# Patient Record
Sex: Female | Born: 1960 | Race: White | Hispanic: No | Marital: Married | Smoking: Never smoker
Health system: Southern US, Community
[De-identification: ages and names within clinical notes are randomized; demographics above are authoritative.]

## PROBLEM LIST (undated history)

## (undated) DIAGNOSIS — K219 Gastro-esophageal reflux disease without esophagitis: Secondary | ICD-10-CM

## (undated) DIAGNOSIS — M629 Disorder of muscle, unspecified: Secondary | ICD-10-CM

## (undated) DIAGNOSIS — E119 Type 2 diabetes mellitus without complications: Secondary | ICD-10-CM

## (undated) HISTORY — PX: TONSILLECTOMY: SUR1361

---

## 2006-06-03 ENCOUNTER — Emergency Department (HOSPITAL_COMMUNITY): Admission: EM | Admit: 2006-06-03 | Discharge: 2006-06-03 | Payer: Self-pay | Admitting: Emergency Medicine

## 2007-12-02 IMAGING — CR DG CHEST 2V
2 series · 2 of 2 positions shown · non-contrast
Comparison: none

CLINICAL DATA: 45-year-old female.  Weakness, chest fluttering, and shortness of breath. 
CHEST - 2 VIEW:

[w chest pa]
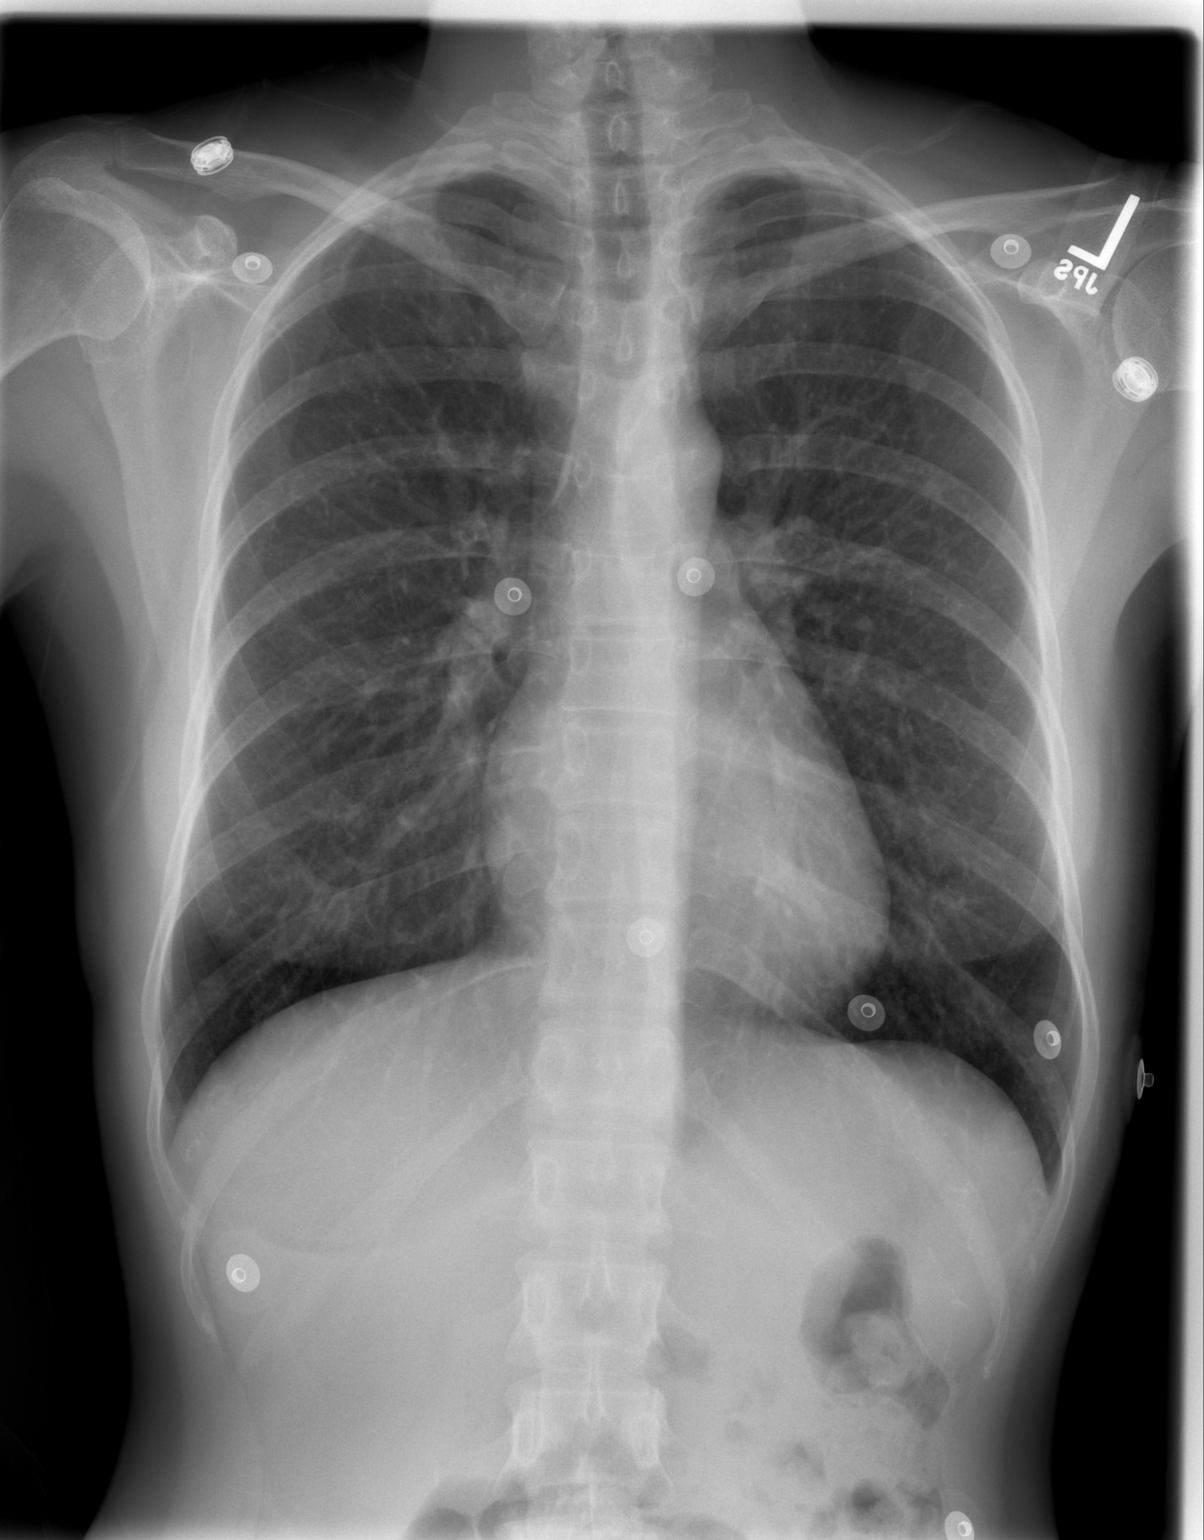

[w chest lat]
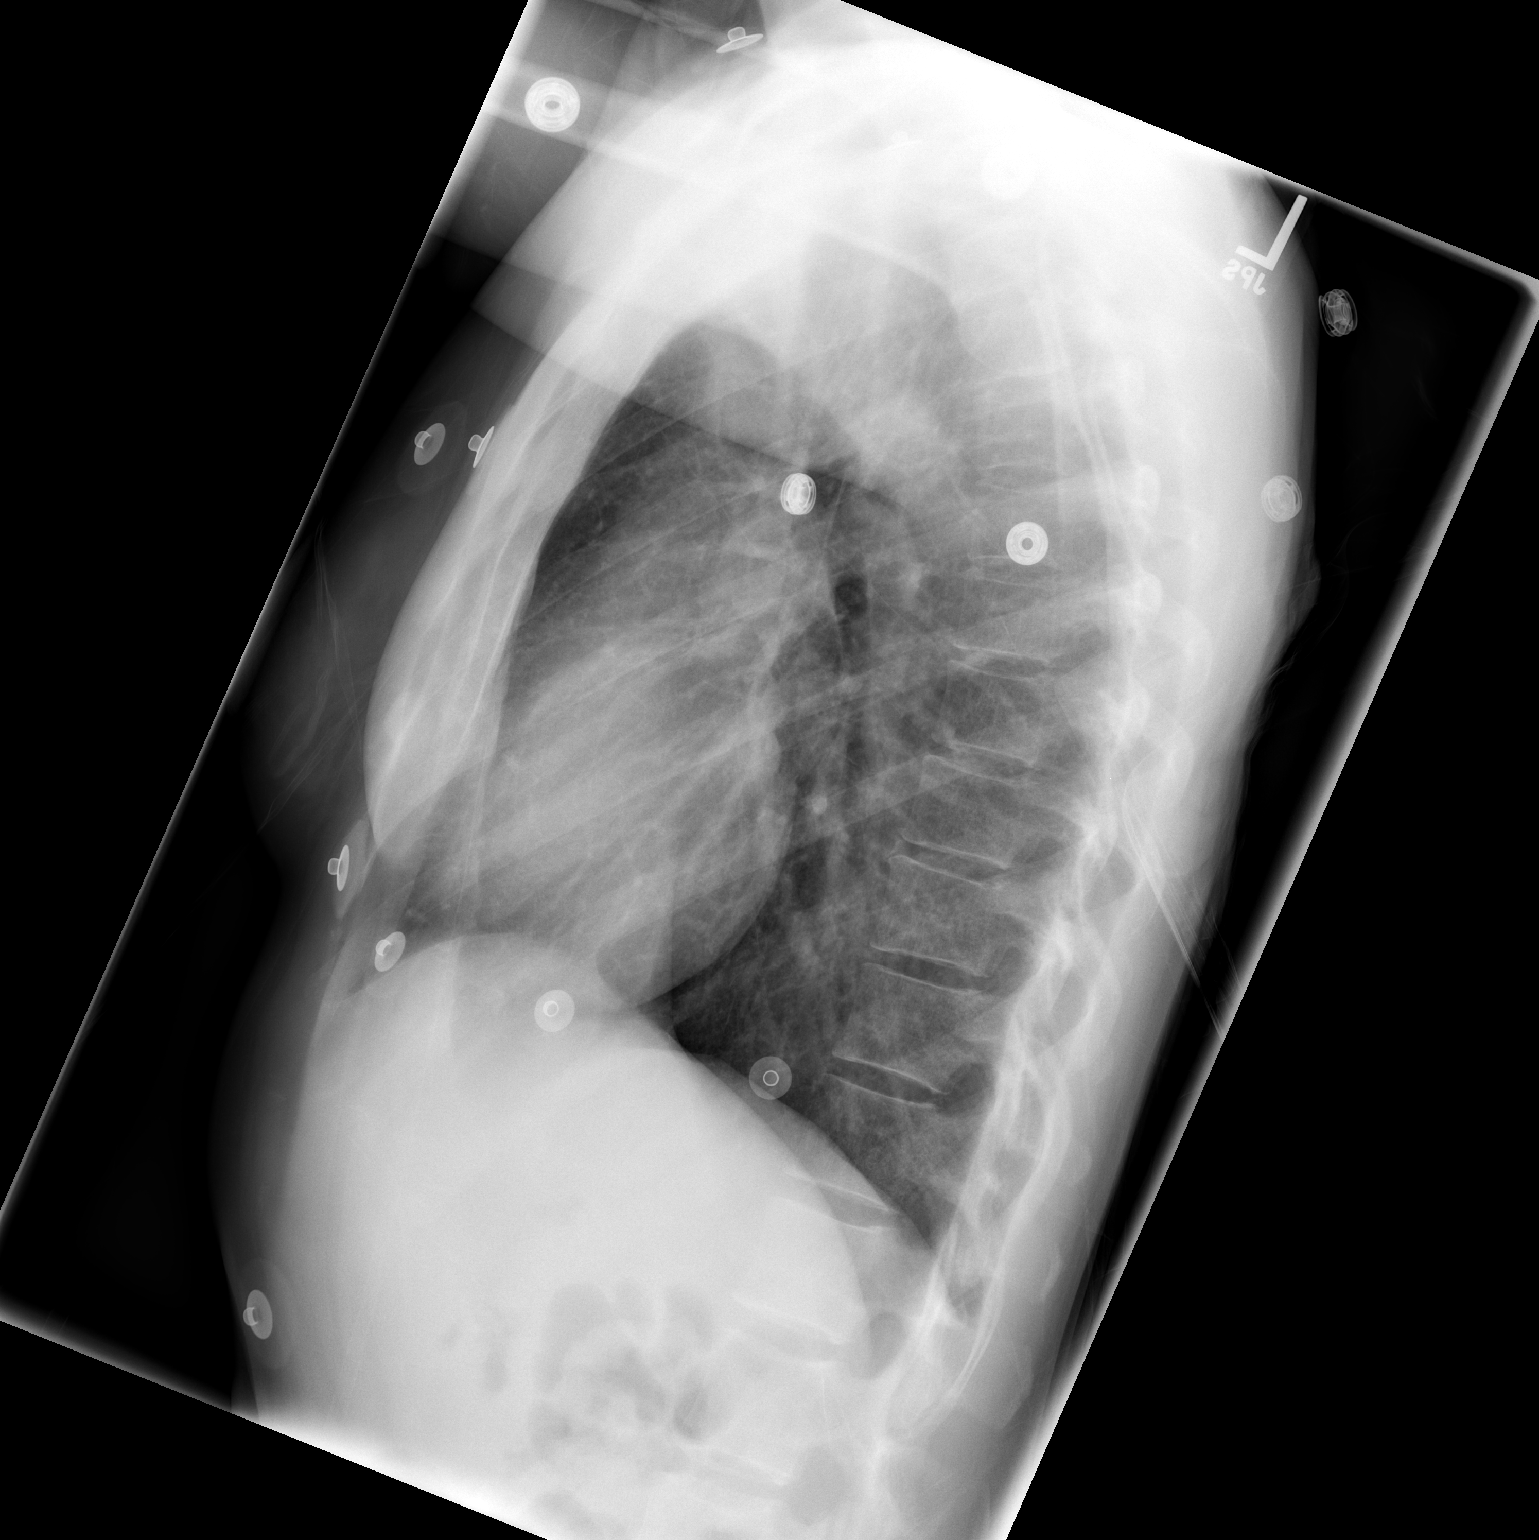

[2 of 2 positions shown; findings below may reference images not displayed]

FINDINGS: Normal heart size.  Mild hyperinflation and interstitial prominence.  No acute consolidation, definite pneumonia, edema, effusion, or pneumothorax.
IMPRESSION: 1.  Mild hyperinflation and bronchial thickening.  
2.  No acute pneumonia.

## 2007-12-02 IMAGING — CT CT HEAD W/O CM
1 series · 16 of 28 positions shown, 20 images · IV contrast (agent unspecified)
Comparison: None.

CLINICAL DATA: Weakness and slurred speech.
 HEAD CT WITHOUT CONTRAST:
TECHNIQUE: Contiguous axial images were obtained from the base of the skull through the vertex according to standard protocol without contrast.

[Series 2: brain · axial · 0.47mm/px · z∈[+141,+274]mm · 16 of 28 slices shown, 20 images]
[im 2/28  brain]
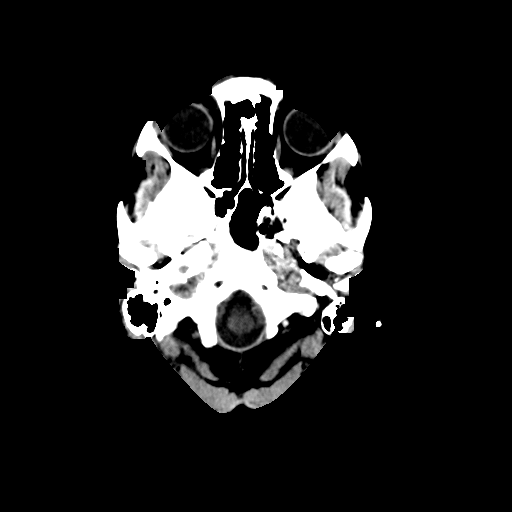
[im 2/28  bone]
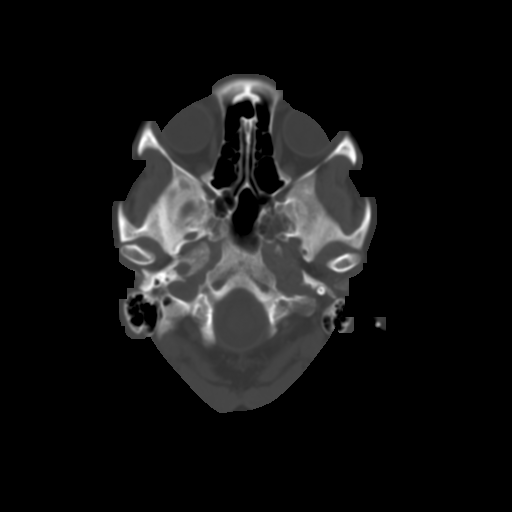
[im 4/28  brain]
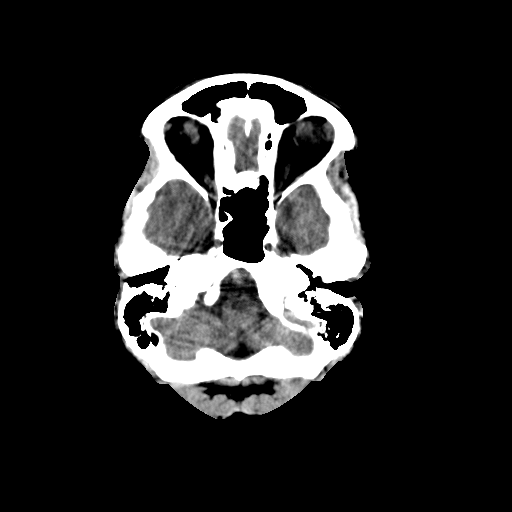
[im 6/28  brain]
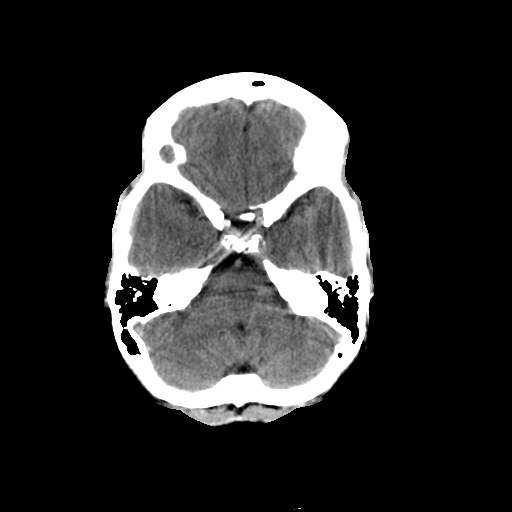
[im 7/28  brain]
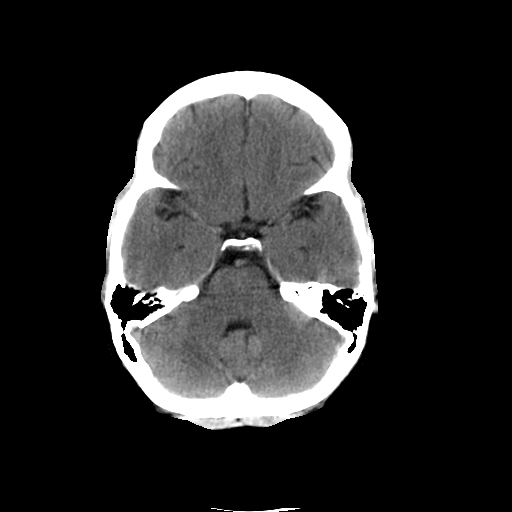
[im 9/28  brain]
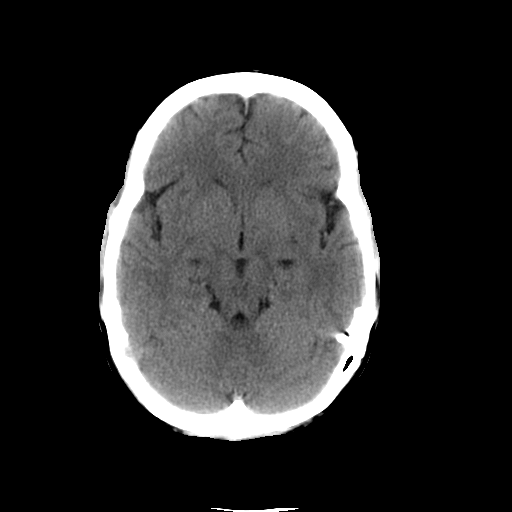
[im 9/28  bone]
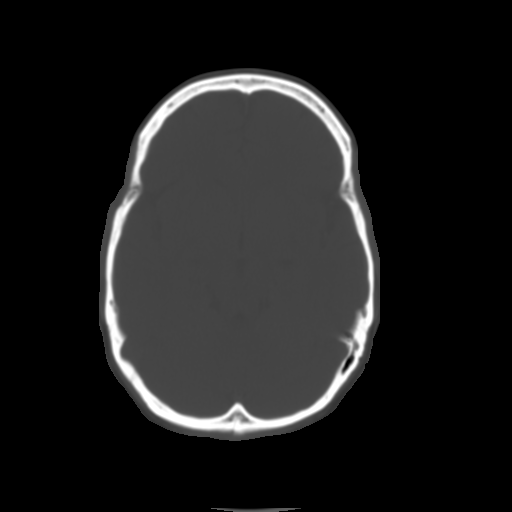
[im 10/28  brain]
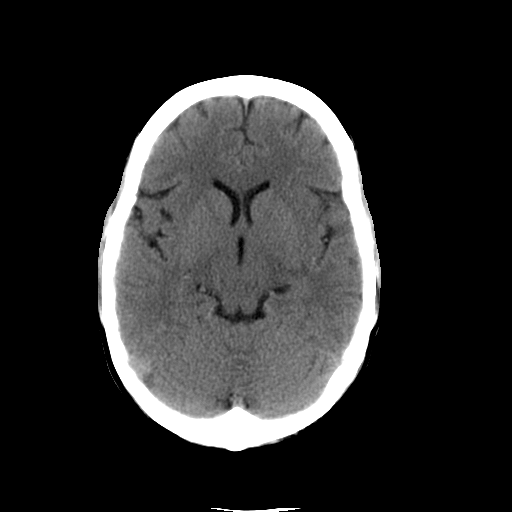
[im 12/28  brain]
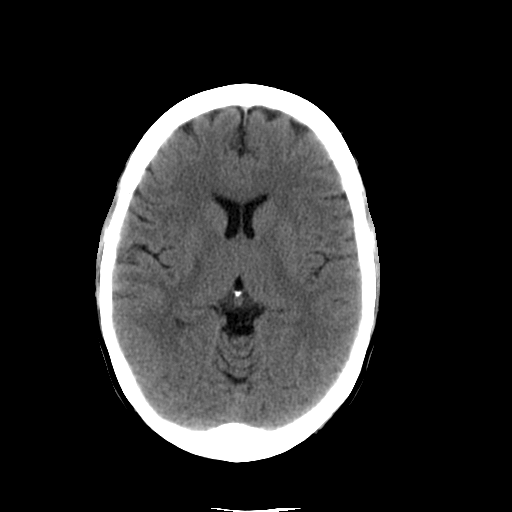
[im 14/28  brain]
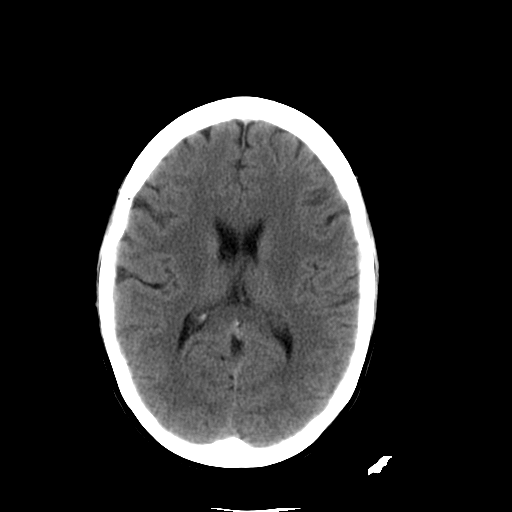
[im 15/28  brain]
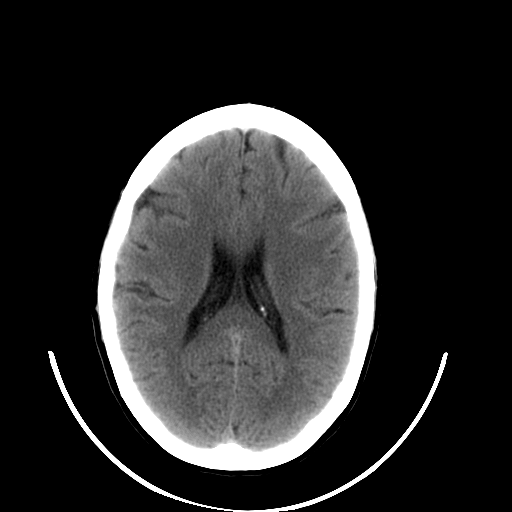
[im 15/28  bone]
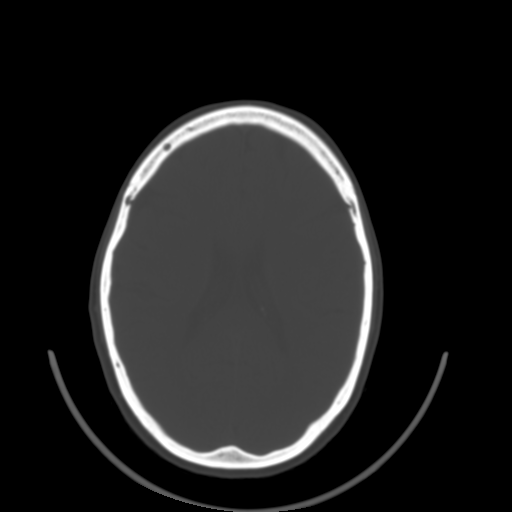
[im 17/28  brain]
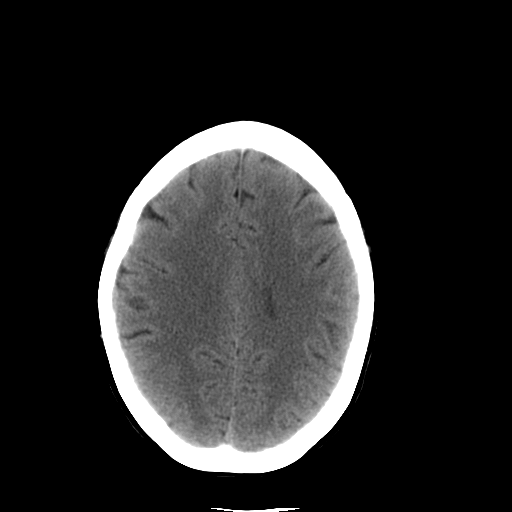
[im 19/28  brain]
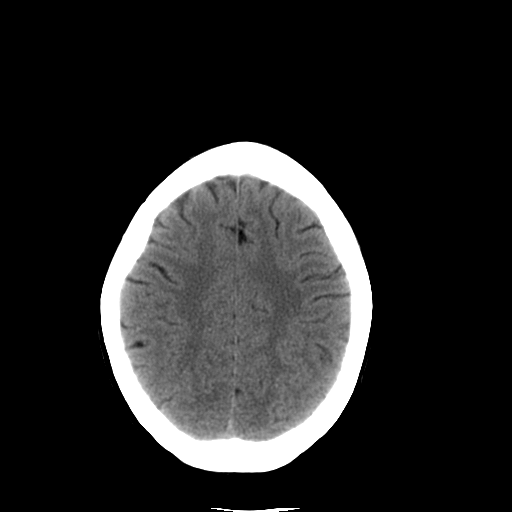
[im 20/28  brain]
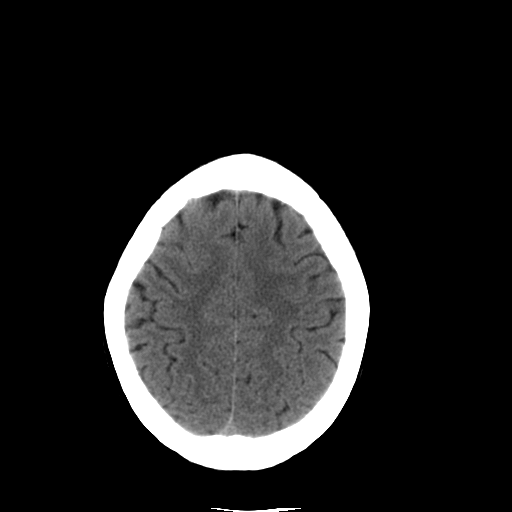
[im 22/28  brain]
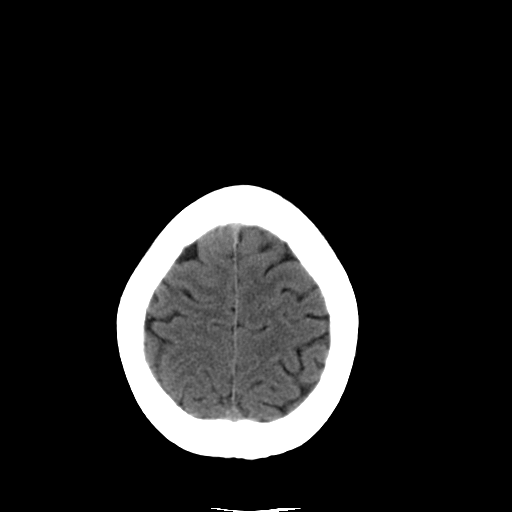
[im 22/28  bone]
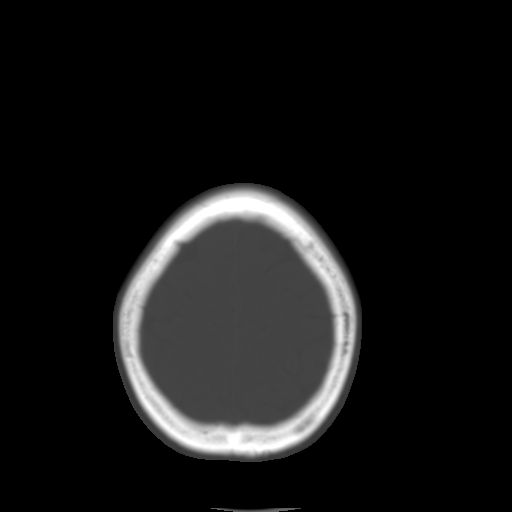
[im 23/28  brain]
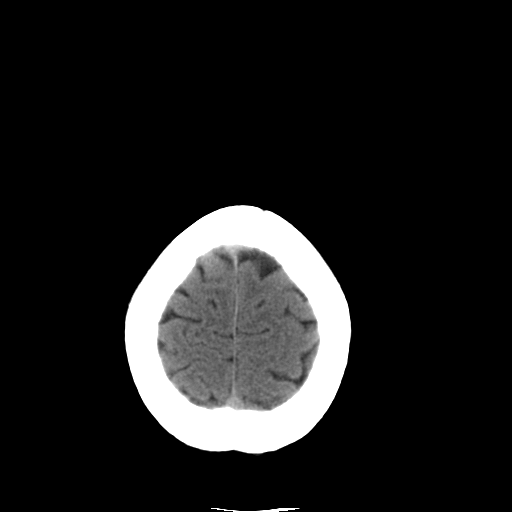
[im 25/28  brain]
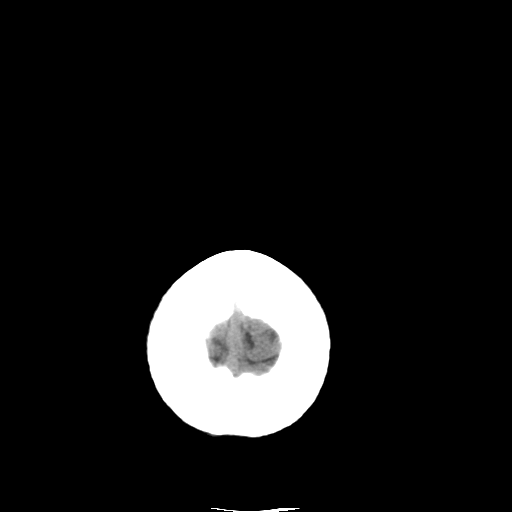
[im 27/28  brain]
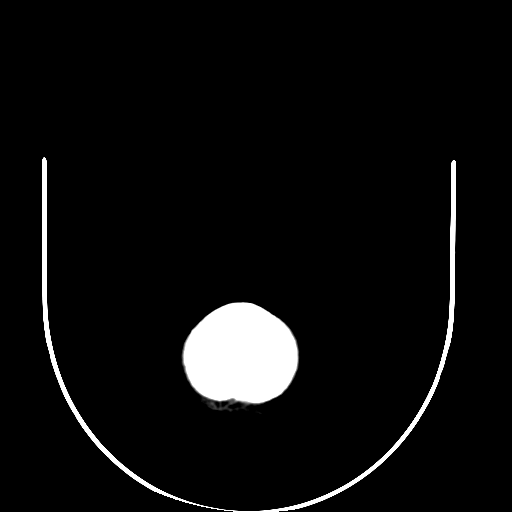

[16 of 28 positions shown; findings below may reference images not displayed]

FINDINGS: Ventricular size and CSF spaces normal. No acute or focal abnormality.  Specifically, no obvious acute infarct or bleed. No that acute ischemic events may be occult on CT in the [DATE] hours.
 Calvarium intact. No fluid in the sinus is visualized.
IMPRESSION: Normal exam ? see note above regarding insensitivity of CT in the early imaging of acute ischemic events.

## 2011-09-03 ENCOUNTER — Encounter: Payer: Self-pay | Admitting: Emergency Medicine

## 2011-09-03 ENCOUNTER — Emergency Department
Admission: EM | Admit: 2011-09-03 | Discharge: 2011-09-03 | Disposition: A | Discharge status: Home / Self Care | Payer: BC Managed Care – PPO | Source: Home / Self Care | Attending: Emergency Medicine | Admitting: Emergency Medicine

## 2011-09-03 DIAGNOSIS — R51 Headache: Secondary | ICD-10-CM

## 2011-09-03 DIAGNOSIS — H5710 Ocular pain, unspecified eye: Secondary | ICD-10-CM

## 2011-09-03 HISTORY — DX: Disorder of muscle, unspecified: M62.9

## 2011-09-03 HISTORY — DX: Gastro-esophageal reflux disease without esophagitis: K21.9

## 2011-09-03 HISTORY — DX: Type 2 diabetes mellitus without complications: E11.9

## 2011-09-03 MED ORDER — AZITHROMYCIN 250 MG PO TABS: ORAL_TABLET | ORAL | Status: AC

## 2011-09-03 NOTE — ED Provider Notes (Signed)
History     CSN: [id number]  Arrival date & time 09/03/11  1620   First MD Initiated Contact with Patient 09/03/11 1657      Chief Complaint  Patient presents with  . Eye Pain    (Consider location/radiation/quality/duration/timing/severity/associated sxs/prior treatment) HPI This is a 51 year old white female who presents today with retro-ocular pain for one week.  No injury no history of migraines.  Her last period was about a year ago with menopause.  She describes it as a shooting pain that feels like it's behind her eyes which only last for 5-30 seconds.  The other time she has a mild dull ache.  No recent stress.  No visual disturbances or blurred vision or dizziness, or other headache, nausea or vomiting.  She has no other upper respiratory symptoms or congestion.  She has not had pain like this in the past.  She does wear glasses and most recently went to her eye doctor a few months ago.  She does not wear contacts.  No trauma or exposure to flying debris.  Past Medical History  Diagnosis Date  . Type 2 diabetes mellitus   . Acid reflux   . Muscle disorder     Past Surgical History  Procedure Date  . Tonsillectomy     Family History  Problem Relation Age of Onset  . Cancer Mother     History  Substance Use Topics  . Smoking status: Never Smoker   . Smokeless tobacco: Not on file  . Alcohol Use: Yes    OB History    Grav Para Term Preterm Abortions TAB SAB Ect Mult Living                  Review of Systems  All other systems reviewed and are negative.    Allergies  Penicillins and Sulfa antibiotics  Home Medications   Current Outpatient Rx  Name Route Sig Dispense Refill  . LANSOPRAZOLE 15 MG PO CPDR Oral Take 15 mg by mouth daily.    Marland Kitchen LEVETIRACETAM 500 MG PO TABS Oral Take 500 mg by mouth every 12 (twelve) hours.    Marland Kitchen SIMVASTATIN 40 MG PO TABS Oral Take 40 mg by mouth every evening.    . AZITHROMYCIN 250 MG PO TABS  Use as directed 1 each 0     BP 108/72  Pulse 69  Temp(Src) 98.6 F (37 C) (Oral)  Resp 16  Ht 5' (1.524 m)  Wt 103 lb (46.72 kg)  BMI 20.12 kg/m2  SpO2 100%  Physical Exam  Nursing note and vitals reviewed. Constitutional: She is oriented to person, place, and time. She appears well-developed and well-nourished.  HENT:  Head: Normocephalic and atraumatic.  Right Ear: Tympanic membrane, external ear and ear canal normal.  Left Ear: Tympanic membrane and external ear normal.  Nose: Nose normal. Right sinus exhibits no maxillary sinus tenderness. Left sinus exhibits no maxillary sinus tenderness.  Mouth/Throat: Uvula is midline, oropharynx is clear and moist and mucous membranes are normal.  Eyes: EOM are normal. Pupils are equal, round, and reactive to light. Right eye exhibits no discharge and no exudate. Left eye exhibits no discharge and no exudate. Right conjunctiva is not injected. Right conjunctiva has no hemorrhage. Left conjunctiva is not injected. Left conjunctiva has no hemorrhage. No scleral icterus. Right eye exhibits normal extraocular motion. Left eye exhibits normal extraocular motion. Pupils are equal.  Fundoscopic exam:      The right eye shows no arteriolar narrowing,  no exudate, no hemorrhage and no papilledema.       The left eye shows no arteriolar narrowing, no exudate, no hemorrhage and no papilledema.  Neck: Neck supple.  Cardiovascular: Regular rhythm and normal heart sounds.   Pulmonary/Chest: Effort normal and breath sounds normal. No respiratory distress.  Neurological: She is alert and oriented to person, place, and time.  Skin: Skin is warm and dry.  Psychiatric: She has a normal mood and affect. Her speech is normal.    ED Course  Procedures (including critical care time)  Labs Reviewed - No data to display No results found.   1. Eye pain   2. Headache       MDM   The differential diagnosis for eye pain includes sinusitis, migraine, I pathology, intracranial, or  headache.  We are going to give her a prescription for a Z-Pak to try since this could be ethmoid sinusitis.  I also gave her a pill of Imitrex to try if the antibiotics are not working.  I told her if the pain gets worse or she is developing new symptoms over the weekend that she will need to come back here.  I do not believe this is an intracranial process.  I also do not believe this is eye-related.  However she is not improving, she will need to followup with her PCP after the weekend versus her ophthalmologist.  Marlaine Hind, MD 09/03/11 [phone]

## 2011-09-03 NOTE — ED Notes (Signed)
Has experienced pain behind left eye approximately one week. No known injury; no hx migraines.

## 2020-03-13 ENCOUNTER — Other Ambulatory Visit: Payer: Self-pay | Admitting: Nurse Practitioner

## 2020-03-13 ENCOUNTER — Telehealth (HOSPITAL_COMMUNITY): Payer: Self-pay | Admitting: Nurse Practitioner

## 2020-03-13 DIAGNOSIS — U071 COVID-19: Secondary | ICD-10-CM

## 2020-03-13 NOTE — Progress Notes (Signed)
I connected by phone with Jacqueline Villa on 03/13/2020 at 4:53 PM to discuss the potential use of a new treatment for mild to moderate COVID-19 viral infection in non-hospitalized patients.  This patient is a 59 y.o. female that meets the FDA criteria for Emergency Use Authorization of COVID monoclonal antibody casirivimab/imdevimab, bamlanivimab/eteseviamb, or sotrovimab.  Has a (+) direct SARS-CoV-2 viral test result  Has mild or moderate COVID-19   Is NOT hospitalized due to COVID-19  Is within 10 days of symptom onset  Has at least one of the high risk factor(s) for progression to severe COVID-19 and/or hospitalization as defined in EUA.  Specific high risk criteria : BMI > 25 and Immunosuppressive Disease or Treatment   I have spoken and communicated the following to the patient or parent/caregiver regarding COVID monoclonal antibody treatment:  1. FDA has authorized the emergency use for the treatment of mild to moderate COVID-19 in adults and pediatric patients with positive results of direct SARS-CoV-2 viral testing who are 43 years of age and older weighing at least 40 kg, and who are at high risk for progressing to severe COVID-19 and/or hospitalization.  2. The significant known and potential risks and benefits of COVID monoclonal antibody, and the extent to which such potential risks and benefits are unknown.  3. Information on available alternative treatments and the risks and benefits of those alternatives, including clinical trials.  4. Patients treated with COVID monoclonal antibody should continue to self-isolate and use infection control measures (e.g., wear mask, isolate, social distance, avoid sharing personal items, clean and disinfect "high touch" surfaces, and frequent handwashing) according to CDC guidelines.   5. The patient or parent/caregiver has the option to accept or refuse COVID monoclonal antibody treatment.  After reviewing this information with the  patient, the patient has agreed to receive one of the available covid 19 monoclonal antibodies and will be provided an appropriate fact sheet prior to infusion. [name], NP 03/13/2020 4:53 PM

## 2020-03-13 NOTE — Telephone Encounter (Signed)
Called to discuss with patient about Covid symptoms and the use of the monoclonal antibody infusion for those with mild to moderate Covid symptoms and at a high risk of hospitalization.     Pt appears to qualify for this infusion due to co-morbid conditions and/or a member of an at-risk group in accordance with the FDA Emergency Use Authorization.    Risk factors include: Sjogren's Syndrome   Symptom onset: 03/08/20 with cough, congestion, HA, fever, and myalgia.   Tested positive for COVID 19: 03/10/20 at [facility] in [place]. on [address]; she will bring positive results with her to the clinic.   Discussed information regarding costs of monoclonal antibody treatment, given both CPT & REV codes, and encouraged patient to call their health insurance company to verify cost of treatment that pt will be financially responsible for.  Patient aware they will receive a call from APP for further information and to schedule appointment. All questions answered.

## 2020-03-13 NOTE — Telephone Encounter (Signed)
Left message for pt to call hotline.

## 2020-03-13 NOTE — Telephone Encounter (Signed)
Left message for pt to call hotline.  History reflects Sjogren's Syndrome.

## 2020-03-14 ENCOUNTER — Ambulatory Visit (HOSPITAL_COMMUNITY)
Admission: RE | Admit: 2020-03-14 | Discharge: 2020-03-14 | Disposition: A | Discharge status: Home / Self Care | Payer: BC Managed Care – PPO | Source: Ambulatory Visit | Attending: Pulmonary Disease | Admitting: Pulmonary Disease

## 2020-03-14 ENCOUNTER — Other Ambulatory Visit (HOSPITAL_COMMUNITY): Payer: Self-pay

## 2020-03-14 DIAGNOSIS — U071 COVID-19: Secondary | ICD-10-CM | POA: Diagnosis not present

## 2020-03-14 MED ORDER — SOTROVIMAB 500 MG/8ML IV SOLN
500.0000 mg | Freq: Once | INTRAVENOUS | Status: AC
  Administered 2020-03-14: 500 mg via INTRAVENOUS
  Filled 2020-03-14: qty 8

## 2020-03-14 MED ORDER — METHYLPREDNISOLONE SODIUM SUCC 125 MG IJ SOLR: 125.0000 mg | Freq: Once | INTRAMUSCULAR | Status: DC | PRN

## 2020-03-14 MED ORDER — EPINEPHRINE 0.3 MG/0.3ML IJ SOAJ: 0.3000 mg | Freq: Once | INTRAMUSCULAR | Status: DC | PRN

## 2020-03-14 MED ORDER — FAMOTIDINE IN NACL 20-0.9 MG/50ML-% IV SOLN: 20.0000 mg | Freq: Once | INTRAVENOUS | Status: DC | PRN

## 2020-03-14 MED ORDER — DIPHENHYDRAMINE HCL 50 MG/ML IJ SOLN: 50.0000 mg | Freq: Once | INTRAMUSCULAR | Status: DC | PRN

## 2020-03-14 MED ORDER — SODIUM CHLORIDE 0.9 % IV SOLN: INTRAVENOUS | Status: DC | PRN

## 2020-03-14 MED ORDER — ALBUTEROL SULFATE HFA 108 (90 BASE) MCG/ACT IN AERS: 2.0000 | INHALATION_SPRAY | Freq: Once | RESPIRATORY_TRACT | Status: DC | PRN

## 2020-03-14 MED ORDER — SODIUM CHLORIDE 0.9 % IV SOLN: 1200.0000 mg | Freq: Once | INTRAVENOUS | Status: DC

## 2020-03-14 NOTE — Progress Notes (Signed)
Patient reviewed Fact Sheet for Patients, Parents, and Caregivers for Emergency Use Authorization (EUA) of Sotrovimab for the Treatment of Coronavirus. Patient also reviewed and is agreeable to the estimated cost of treatment. Patient is agreeable to proceed.   

## 2020-03-14 NOTE — Discharge Instructions (Signed)
10 Things You Can Do to Manage Your COVID-19 Symptoms at Home If you have possible or confirmed COVID-19: 1. Stay home from work and school. And stay away from other public places. If you must go out, avoid using any kind of public transportation, ridesharing, or taxis. 2. Monitor your symptoms carefully. If your symptoms get worse, call your healthcare provider immediately. 3. Get rest and stay hydrated. 4. If you have a medical appointment, call the healthcare provider ahead of time and tell them that you have or may have COVID-19. 5. For medical emergencies, call 911 and notify the dispatch personnel that you have or may have COVID-19. 6. Cover your cough and sneezes with a tissue or use the inside of your elbow. 7. Wash your hands often with soap and water for at least 20 seconds or clean your hands with an alcohol-based hand sanitizer that contains at least 60% alcohol. 8. As much as possible, stay in a specific room and away from other people in your home. Also, you should use a separate bathroom, if available. If you need to be around other people in or outside of the home, wear a mask. 9. Avoid sharing personal items with other people in your household, like dishes, towels, and bedding. 10. Clean all surfaces that are touched often, like counters, tabletops, and doorknobs. Use household cleaning sprays or wipes according to the label instructions. cdc.gov/coronavirus 10/11/2018 This information is not intended to replace advice given to you by your health care provider. Make sure you discuss any questions you have with your health care provider. Document Revised: 03/15/2019 Document Reviewed: 03/15/2019 Elsevier Patient Education  2020 Elsevier Inc. What types of side effects do monoclonal antibody drugs cause?  Common side effects  In general, the more common side effects caused by monoclonal antibody drugs include: . Allergic reactions, such as hives or itching . Flu-like signs and  symptoms, including chills, fatigue, fever, and muscle aches and pains . Nausea, vomiting . Diarrhea . Skin rashes . Low blood pressure   The CDC is recommending patients who receive monoclonal antibody treatments wait at least 90 days before being vaccinated.  Currently, there are no data on the safety and efficacy of mRNA COVID-19 vaccines in persons who received monoclonal antibodies or convalescent plasma as part of COVID-19 treatment. Based on the estimated half-life of such therapies as well as evidence suggesting that reinfection is uncommon in the 90 days after initial infection, vaccination should be deferred for at least 90 days, as a precautionary measure until additional information becomes available, to avoid interference of the antibody treatment with vaccine-induced immune responses. If you have any questions or concerns after the infusion please call the Advanced Practice Provider on call at [phone]. This number is ONLY intended for your use regarding questions or concerns about the infusion post-treatment side-effects.  Please do not provide this number to others for use. For return to work notes please contact your primary care provider.   If someone you know is interested in receiving treatment please have them call the COVID hotline at [phone].   

## 2020-03-14 NOTE — Progress Notes (Signed)
Diagnosis: COVID-19  Physician: Dr. Patrick Wright  Procedure: Covid Infusion Clinic Med: Sotrovimab infusion - Provided patient with sotrovimab fact sheet for patients, parents, and caregivers prior to infusion.   Complications: No immediate complications noted  Discharge: Discharged home    

## 2024-02-14 ENCOUNTER — Ambulatory Visit: Attending: Cardiovascular Disease | Admitting: Cardiovascular Disease

## 2024-02-14 ENCOUNTER — Encounter: Payer: Self-pay | Admitting: Cardiovascular Disease

## 2024-02-14 VITALS — BP 112/60 | HR 74 | Ht 60.0 in | Wt 114.8 lb

## 2024-02-14 DIAGNOSIS — R072 Precordial pain: Secondary | ICD-10-CM | POA: Diagnosis not present

## 2024-02-14 DIAGNOSIS — R079 Chest pain, unspecified: Secondary | ICD-10-CM | POA: Diagnosis not present

## 2024-02-14 MED ORDER — METOPROLOL TARTRATE 100 MG PO TABS: 100.0000 mg | ORAL_TABLET | Freq: Once | ORAL | 0 refills | Status: AC

## 2024-02-14 NOTE — Patient Instructions (Signed)
 Medication Instructions:  No changes *If you need a refill on your cardiac medications before your next appointment, please call your pharmacy*  Lab Work: None ordered If you have labs (blood work) drawn today and your tests are completely normal, you will receive your results only by: MyChart Message (if you have MyChart) OR A paper copy in the mail If you have any lab test that is abnormal or we need to change your treatment, we will call you to review the results.  Testing/Procedures:   Your cardiac CT will be scheduled at one of the below locations:   [facility] [address]  [place], [place] [address]  If scheduled at the [facility] at [address], please enter the parking lot using the [address] entrance and use the FREE valet service at the patient drop-off area. Enter the building and check-in with registration on the main floor.  Please follow these instructions carefully (unless otherwise directed):  An IV will be required for this test and Nitroglycerin will be given.   On the Night Before the Test: Be sure to Drink plenty of water. Do not consume any caffeinated/decaffeinated beverages or chocolate 12 hours prior to your test. Do not take any antihistamines 12 hours prior to your test.  On the Day of the Test: Drink plenty of water until 1 hour prior to the test. Do not eat any food 1 hour prior to test. You may take your regular medications prior to the test.  Take metoprolol (Lopressor) 100 mg- two hours prior to test. Patients who wear a continuous glucose monitor MUST remove the device prior to scanning. FEMALES- please wear underwire-free bra if available, avoid dresses & tight clothing  After the Test: Drink plenty of water. After receiving IV contrast, you may experience a mild flushed feeling. This is normal. On occasion, you may experience a mild rash up to 24 hours after the test. This is not  dangerous. If this occurs, you can take Benadryl  25 mg, Zyrtec, Claritin, or Allegra and increase your fluid intake. (Patients taking Tikosyn should avoid Benadryl , and may take Zyrtec, Claritin, or Allegra) If you experience trouble breathing, this can be serious. If it is severe call 911 IMMEDIATELY. If it is mild, please call our office.  We will call to schedule your test 2-4 weeks out understanding that some insurance companies will need an authorization prior to the service being performed.   For more information and frequently asked questions, please visit our website : [email or web address]  For non-scheduling related questions, please contact the cardiac imaging nurse navigator should you have any questions/concerns: Cardiac Imaging Nurse Navigators Direct Office Dial: [phone]   For scheduling needs, including cancellations and rescheduling, please call Brittany, [phone].   Follow-Up: At [facility], you and your health needs are our priority.  As part of our continuing mission to provide you with exceptional heart care, our providers are all part of one team.  This team includes your primary Cardiologist (physician) and Advanced Practice Providers or APPs (Physician Assistants and Nurse Practitioners) who all work together to provide you with the care you need, when you need it.  Your next appointment:    04/10/24 at 08:20  Provider:   Dr Francyne  We recommend signing up for the patient portal called MyChart.  Sign up information is provided on this After Visit Summary.  MyChart is used to connect with patients for Virtual Visits (Telemedicine).  Patients are  able to view lab/test results, encounter notes, upcoming appointments, etc.  Non-urgent messages can be sent to your provider as well.   To learn more about what you can do with MyChart, go to [email or web address].

## 2024-02-14 NOTE — Progress Notes (Unsigned)
 Cardiology Office Note:    Date:  02/16/2024   ID:  Jacqueline Villa, DOB 1960/06/20, MRN [id number]  PCP:  Maritza Alm HERO, MD   [facility] Providers Cardiologist:  None     Referring MD: Bulah Twyla CROME, MD   No chief complaint on file. Jacqueline Villa is a 63 y.o. female who is being seen today for the evaluation of chest discomfort at the request of Barhams, Twyla CROME, MD.   History of Present Illness:    Jacqueline Villa is a 63 y.o. female who presents with exertional shortness of breath and chest pressure.  She experiences shortness of breath and chest pressure during physical activities such as walking uphill, working on trees outside, and pushing a cart on the beach. These symptoms typically resolve with rest and began approximately five months ago, remaining consistent since then. The chest pressure occurs predictably with exertion and alleviates upon cessation of activity. She does not experience these symptoms while at rest or during light activities.  Occasionally, she experiences heart palpitations, described as a fluttering sensation, which occur independently of exertion and can happen while lying in bed. No episodes of syncope, swelling, or shortness of breath while lying down. She snores, but her husband does not notice any apneic episodes during her sleep. She maintains a good energy level and does not require frequent naps.  Her past medical history includes a previous stress echocardiogram. She has had her gallbladder removed and previously experienced migraines, which have resolved over the past ten years. She was on simvastatin for cholesterol management but discontinued it due to changes in her blood sugar levels.  Family history is significant for heart disease, with her grandmother and great-grandmother having died from heart-related issues at ages 85 and 52, respectively. Her brother underwent heart valve replacement surgery ten years ago. Her  mother died of colon cancer at age 68.  She does not smoke and maintains a healthy diet. Her LDL cholesterol was noted to be 117 mg/dL, which is slightly elevated given her family history and potential risk factors.  There is a mention of diabetes mellitus as part of her past medical history, but her recent hemoglobin A1c was only 5.4%.  Her medication list includes simvastatin but she has not been taking this medication.  Past Medical History:  Diagnosis Date   Acid reflux    Muscle disorder    Type 2 diabetes mellitus (HCC)     Past Surgical History:  Procedure Laterality Date   TONSILLECTOMY      Current Medications: Current Meds  Medication Sig   Accu-Chek Softclix Lancets lancets USE TO CHECK BLOOD SUGAR BEFORE MEALS AND AT BEDTIME   betamethasone dipropionate 0.05 % cream Apply 1 Application topically as needed.   Blood Glucose Monitoring Suppl (CHEMSTRIP BG LOG BOOK) MISC Glucometer - Preferred brand, Daily, Check blood sugar before meals and at bedtime   denosumab (PROLIA) 60 MG/ML SOSY injection Inject 60 mg into the skin every 6 (six) months.   estradiol (ESTRACE) 0.01 % CREA vaginal cream Place 1 Applicatorful vaginally. Every 3 nights   fluticasone (FLONASE) 50 MCG/ACT nasal spray Place 2 sprays into both nostrils daily.   frovatriptan (FROVA) 2.5 MG tablet Take 2.5 mg by mouth as needed for migraine.   glucose blood (PRECISION QID TEST) test strip 1 each by Other route.   levETIRAcetam (KEPPRA) 500 MG tablet Take 250 mg by mouth 2 (two) times daily.   metoprolol tartrate (LOPRESSOR) 100 MG  tablet Take 1 tablet (100 mg total) by mouth once for 1 dose. PLEASE TAKE METOPROLOL 2  HOURS PRIOR TO CTA SCAN.   Multiple Vitamin (MULTI-VITAMIN) tablet Take 1 tablet by mouth daily.   pantoprazole (PROTONIX) 40 MG tablet Take 40 mg by mouth daily.   SUMAtriptan (IMITREX) 100 MG tablet Take 100 mg by mouth as needed for migraine.   Vitamin D, Ergocalciferol, (DRISDOL) 1.25 MG (50000  UNIT) CAPS capsule Take 50,000 Units by mouth every 7 (seven) days.     Allergies:   Penicillins, Benzalkonium chloride, Neomycin-bacitracin zn-polymyx, Pilocarpine, Sulfur, Sulfa antibiotics, and Lactose   Social History   Socioeconomic History   Marital status: Married    Spouse name: Not on file   Number of children: Not on file   Years of education: Not on file   Highest education level: Not on file  Occupational History   Not on file  Tobacco Use   Smoking status: Never   Smokeless tobacco: Not on file  Substance and Sexual Activity   Alcohol use: Yes   Drug use: No   Sexual activity: Not on file  Other Topics Concern   Not on file  Social History Narrative   Not on file   Social Drivers of Health   Financial Resource Strain: Low Risk  (02/01/2022)   Received from [facility] visits prior to 06/12/2022., [facility]   Overall Financial Resource Strain (CARDIA)    Difficulty of Paying Living Expenses: Not hard at all  Food Insecurity: No Food Insecurity (05/10/2023)   Received from [facility]   Hunger Vital Sign    Within the past 12 months, you worried that your food would run out before you got the money to buy more.: Never true    Within the past 12 months, the food you bought just didn't last and you didn't have money to get more.: Never true  Transportation Needs: No Transportation Needs (05/10/2023)   Received from [facility] - Transportation    Lack of Transportation (Medical): No    Lack of Transportation (Non-Medical): No  Physical Activity: Sufficiently Active (05/10/2023)   Received from [facility]   Exercise Vital Sign    On average, how many days per week do you engage in moderate to strenuous exercise (like a brisk walk)?: 6 days    On average, how many minutes do you engage in exercise at this level?: 30 min  Stress: No Stress Concern Present (05/10/2023)   Received from [facility]  Occupational Health - Occupational Stress Questionnaire    Feeling of Stress : Not at all  Social Connections: Socially Integrated (05/10/2023)   Received from [facility]   Social Network    How would you rate your social network (family, work, friends)?: Good participation with social networks     Family History: The patient's family history includes Cancer in her mother.  ROS:   Please see the history of present illness.     All other systems reviewed and are negative.  EKGs/Labs/Other Studies Reviewed:    The following studies were reviewed today: Stress echo at [facility] 10/05/2023 Achieved 10 METS, no ECG changes to suggest ischemia, normal LV function/regional wall motion after stress   EKG Interpretation Date/Time:  Tuesday February 14 2024 09:11:53 EST Ventricular Rate:  74 PR Interval:  118 QRS Duration:  82 QT Interval:  400 QTC Calculation: 444 R Axis:  66  Text Interpretation: Normal sinus rhythm Possible Left atrial enlargement When compared with ECG of 03-Jun-2006 11:54, Nonspecific T wave abnormality now evident in Inferior leads Confirmed by Salihah Peckham [phone]) on 02/14/2024 9:32:19 AM    Recent Labs: No results found for requested labs within last 365 days.  Recent Lipid Panel No results found for: CHOL, TRIG, HDL, CHOLHDL, VLDL, LDLCALC, LDLDIRECT 02/05/2023 Cholesterol 205, triglycerides 51, HDL 76, calculated LDL 116 TSH 1.659, hemoglobin 13.8 08/31/2023 Creatinine 0.90, potassium 4.0, glucose 92  Risk Assessment/Calculations:             Physical Exam:    VS:  BP 112/60 (BP Location: Left Arm, Patient Position: Sitting, Cuff Size: Normal)   Pulse 74   Ht 5' (1.524 m)   Wt 114 lb 12.8 oz (52.1 kg)   SpO2 98%   BMI 22.42 kg/m     Wt Readings from Last 3 Encounters:  02/14/24 114 lb 12.8 oz (52.1 kg)  09/03/11 103 lb (46.7 kg)     GEN: Appears lean and fit, younger than stated age, well nourished, well developed  in no acute distress HEENT: Normal NECK: No JVD; No carotid bruits LYMPHATICS: No lymphadenopathy CARDIAC: RRR, no murmurs, rubs, gallops RESPIRATORY:  Clear to auscultation without rales, wheezing or rhonchi  ABDOMEN: Soft, non-tender, non-distended MUSCULOSKELETAL:  No edema; No deformity  SKIN: Warm and dry NEUROLOGIC:  Alert and oriented x 3 PSYCHIATRIC:  Normal affect   ASSESSMENT:    1. Chest pain, unspecified type   2. Precordial pain    PLAN:    In order of problems listed above:  Stable angina: Reports of pressure and dyspnea predictably induced by exertion and relieved by rest, consistent with angina pectoris. Symptoms present for five months. Family history of early-onset heart disease. Coronary blockages are suspected but the clinical scenario does not suggest need for urgent invasive evaluation. Explained the difference between stable and unstable angina, emphasizing the importance of recognizing symptoms that may indicate a more urgent condition. Stable angina can persist for years without leading to myocardial infarction unless symptoms change to unstable angina. Order coronary CT angiogram to assess for coronary blockages. Prescribe nitroglycerin for use as needed for chest pain. Rapid symptom relief with nitroglycerin with help confirm that this is angina.  Instruct on the use of aspirin for symptoms suggestive of unstable angina, such as prolonged chest pain at rest. Schedule follow-up appointment for December 30th to review results and determine further management, but if the coronary CT angiogram shows more serious findings we will be in touch with her much sooner. Hyperlipidemia: LDL cholesterol is 116 mg/dL, considered elevated if coronary blockages or excess plaque are found. Aggressive cholesterol management, potentially requiring a statin to lower LDL to below 70 mg/dL, will be necessary if blockages are present. Cholesterol management depends on the risk category,  which will be reassessed after the CT angiogram results.         Medication Adjustments/Labs and Tests Ordered: Current medicines are reviewed at length with the patient today.  Concerns regarding medicines are outlined above.  Orders Placed This Encounter  Procedures   CT CORONARY MORPH W/CTA COR W/SCORE W/CA W/CM &/OR WO/CM   EKG 12-Lead   Meds ordered this encounter  Medications   metoprolol tartrate (LOPRESSOR) 100 MG tablet    Sig: Take 1 tablet (100 mg total) by mouth once for 1 dose. PLEASE TAKE METOPROLOL 2  HOURS PRIOR TO CTA SCAN.    Dispense:  1  tablet    Refill:  0    Patient Instructions  Medication Instructions:  No changes *If you need a refill on your cardiac medications before your next appointment, please call your pharmacy*  Lab Work: None ordered If you have labs (blood work) drawn today and your tests are completely normal, you will receive your results only by: MyChart Message (if you have MyChart) OR A paper copy in the mail If you have any lab test that is abnormal or we need to change your treatment, we will call you to review the results.  Testing/Procedures:   Your cardiac CT will be scheduled at one of the below locations:   [facility] [address]  [place], [place] [address]  If scheduled at the Heart and Vascular Tower at [address], please enter the parking lot using the [address] entrance and use the FREE valet service at the patient drop-off area. Enter the building and check-in with registration on the main floor.  Please follow these instructions carefully (unless otherwise directed):  An IV will be required for this test and Nitroglycerin will be given.   On the Night Before the Test: Be sure to Drink plenty of water. Do not consume any caffeinated/decaffeinated beverages or chocolate 12 hours prior to your test. Do not take any antihistamines 12 hours prior to your test.  On the Day of  the Test: Drink plenty of water until 1 hour prior to the test. Do not eat any food 1 hour prior to test. You may take your regular medications prior to the test.  Take metoprolol (Lopressor) 100 mg- two hours prior to test. Patients who wear a continuous glucose monitor MUST remove the device prior to scanning. FEMALES- please wear underwire-free bra if available, avoid dresses & tight clothing  After the Test: Drink plenty of water. After receiving IV contrast, you may experience a mild flushed feeling. This is normal. On occasion, you may experience a mild rash up to 24 hours after the test. This is not dangerous. If this occurs, you can take Benadryl  25 mg, Zyrtec, Claritin, or Allegra and increase your fluid intake. (Patients taking Tikosyn should avoid Benadryl , and may take Zyrtec, Claritin, or Allegra) If you experience trouble breathing, this can be serious. If it is severe call 911 IMMEDIATELY. If it is mild, please call our office.  We will call to schedule your test 2-4 weeks out understanding that some insurance companies will need an authorization prior to the service being performed.   For more information and frequently asked questions, please visit our website : [email or web address]  For non-scheduling related questions, please contact the cardiac imaging nurse navigator should you have any questions/concerns: Cardiac Imaging Nurse Navigators Direct Office Dial: [phone]   For scheduling needs, including cancellations and rescheduling, please call Brittany, [phone].   Follow-Up: At [facility], you and your health needs are our priority.  As part of our continuing mission to provide you with exceptional heart care, our providers are all part of one team.  This team includes your primary Cardiologist (physician) and Advanced Practice Providers or APPs (Physician Assistants and Nurse Practitioners) who all work together to provide you with the  care you need, when you need it.  Your next appointment:    04/10/24 at 08:20  Provider:   Dr Francyne  We recommend signing up for the patient portal called MyChart.  Sign up information is provided on this After Visit Summary.  MyChart is used to connect with patients for Virtual Visits (Telemedicine).  Patients are able to view lab/test results, encounter notes, upcoming appointments, etc.  Non-urgent messages can be sent to your provider as well.   To learn more about what you can do with MyChart, go to [email or web address].      Signed, Jerel Balding, MD  02/16/2024 12:40 PM    [facility]

## 2024-02-22 ENCOUNTER — Telehealth (HOSPITAL_COMMUNITY): Payer: Self-pay | Admitting: *Deleted

## 2024-02-22 NOTE — Telephone Encounter (Signed)
 Reaching out to patient to offer assistance regarding upcoming cardiac imaging study; pt verbalizes understanding of appt date/time, parking situation and where to check in, pre-test NPO status and medications ordered, and verified current allergies; name and call back number provided for further questions should they arise Sid Seats RN Navigator Cardiac Imaging [facility] [phone] office [phone] cell

## 2024-02-23 ENCOUNTER — Ambulatory Visit (HOSPITAL_COMMUNITY)
Admission: RE | Admit: 2024-02-23 | Discharge: 2024-02-23 | Disposition: A | Discharge status: Home / Self Care | Source: Ambulatory Visit | Attending: Cardiology | Admitting: Cardiology

## 2024-02-23 ENCOUNTER — Ambulatory Visit: Payer: Self-pay | Admitting: Cardiology

## 2024-02-23 DIAGNOSIS — R079 Chest pain, unspecified: Secondary | ICD-10-CM | POA: Insufficient documentation

## 2024-02-23 DIAGNOSIS — R072 Precordial pain: Secondary | ICD-10-CM | POA: Diagnosis present

## 2024-02-23 LAB — POCT I-STAT CREATININE: Creatinine, Ser: 0.8 mg/dL (ref 0.44–1.00)

## 2024-02-23 MED ORDER — NITROGLYCERIN 0.4 MG SL SUBL
0.8000 mg | SUBLINGUAL_TABLET | Freq: Once | SUBLINGUAL | Status: AC
  Administered 2024-02-23: 0.8 mg via SUBLINGUAL

## 2024-02-23 MED ORDER — IOHEXOL 350 MG/ML SOLN
95.0000 mL | Freq: Once | INTRAVENOUS | Status: AC | PRN
  Administered 2024-02-23: 95 mL via INTRAVENOUS

## 2024-02-24 ENCOUNTER — Ambulatory Visit: Payer: Self-pay | Admitting: Cardiovascular Disease

## 2024-02-24 DIAGNOSIS — E785 Hyperlipidemia, unspecified: Secondary | ICD-10-CM

## 2024-02-24 DIAGNOSIS — R0602 Shortness of breath: Secondary | ICD-10-CM

## 2024-02-24 DIAGNOSIS — R079 Chest pain, unspecified: Secondary | ICD-10-CM

## 2024-02-24 MED ORDER — ATORVASTATIN CALCIUM 20 MG PO TABS: 20.0000 mg | ORAL_TABLET | Freq: Every day | ORAL | 3 refills | Status: AC

## 2024-02-24 NOTE — Telephone Encounter (Signed)
 RX sent, Echo and Lipid panel ordered. [facility] message sent to pt

## 2024-02-28 ENCOUNTER — Ambulatory Visit (HOSPITAL_COMMUNITY)
Admission: RE | Admit: 2024-02-28 | Discharge: 2024-02-28 | Disposition: A | Discharge status: Home / Self Care | Source: Ambulatory Visit | Attending: Cardiology | Admitting: Cardiology

## 2024-02-28 DIAGNOSIS — R0602 Shortness of breath: Secondary | ICD-10-CM | POA: Insufficient documentation

## 2024-02-28 DIAGNOSIS — R079 Chest pain, unspecified: Secondary | ICD-10-CM | POA: Insufficient documentation

## 2024-02-28 LAB — ECHOCARDIOGRAM COMPLETE
Area-P 1/2: 3.61 cm2
S' Lateral: 2.9 cm

## 2024-02-29 ENCOUNTER — Ambulatory Visit: Payer: Self-pay | Admitting: Cardiovascular Disease

## 2024-04-10 ENCOUNTER — Ambulatory Visit: Attending: Cardiovascular Disease | Admitting: Cardiovascular Disease

## 2024-04-10 VITALS — BP 112/60 | HR 79 | Ht 60.0 in | Wt 117.0 lb

## 2024-04-10 DIAGNOSIS — E785 Hyperlipidemia, unspecified: Secondary | ICD-10-CM

## 2024-04-10 DIAGNOSIS — R931 Abnormal findings on diagnostic imaging of heart and coronary circulation: Secondary | ICD-10-CM

## 2024-04-10 DIAGNOSIS — I2089 Other forms of angina pectoris: Secondary | ICD-10-CM

## 2024-04-10 NOTE — Progress Notes (Signed)
 " Cardiology Office Note:    Date:  04/14/2024   ID:  Jacqueline Villa, DOB 1960/09/30, MRN [id number]  PCP:  Jacqueline Alm HERO, MD   [facility] Providers Cardiologist:  None     Referring MD: Jacqueline Alm HERO, MD   Chief Complaint  Patient presents with   Chest Pain    History of Present Illness:    Jacqueline Villa is a 63 y.o. female who presented with exertional shortness of breath and chest pressure.  She underwent a coronary CT angiogram that showed a high burden of calcified plaque (calcium  score 180, 91st percentile), but did not show any meaningful coronary obstruction (maximum stenosis 25-49% in the proximal LAD artery).  Her echocardiogram showed normal left ventricular systolic and diastolic function and no significant valvular abnormalities.  Estimated PA pressure was normal.  She subsequently underwent an echocardiogram that showed normal left ventricular systolic and diastolic function and no valvular abnormalities.  Family history is significant for heart disease, with her grandmother and great-grandmother having died from heart-related issues at ages 30 and 33, respectively. Her brother underwent heart valve replacement surgery ten years ago. Her mother died of colon cancer at age 15.  She does not smoke and maintains a healthy diet. Her LDL cholesterol was noted to be 117 mg/dL, which is slightly elevated given her family history and potential risk factors.  There is a mention of diabetes mellitus as part of her past medical history, but her recent hemoglobin A1c was only 5.4%.    After we saw the results of the coronary CT with a markedly elevated coronary calcium  score we added atorvastatin  which she has been taking now for about a month.  So far no complaints of side effects.  Past Medical History:  Diagnosis Date   Acid reflux    Muscle disorder    Type 2 diabetes mellitus (HCC)     Past Surgical History:  Procedure Laterality Date   TONSILLECTOMY       Current Medications: Current Meds  Medication Sig   Accu-Chek Softclix Lancets lancets USE TO CHECK BLOOD SUGAR BEFORE MEALS AND AT BEDTIME   atorvastatin  (LIPITOR) 20 MG tablet Take 1 tablet (20 mg total) by mouth daily.   betamethasone dipropionate 0.05 % cream Apply 1 Application topically as needed.   Blood Glucose Monitoring Suppl (CHEMSTRIP BG LOG BOOK) MISC Glucometer - Preferred brand, Daily, Check blood sugar before meals and at bedtime   denosumab (PROLIA) 60 MG/ML SOSY injection Inject 60 mg into the skin every 6 (six) months.   estradiol (ESTRACE) 0.01 % CREA vaginal cream Place 1 Applicatorful vaginally. Every 3 nights   fluticasone (FLONASE) 50 MCG/ACT nasal spray Place 2 sprays into both nostrils daily.   frovatriptan (FROVA) 2.5 MG tablet Take 2.5 mg by mouth as needed for migraine.   glucose blood (PRECISION QID TEST) test strip 1 each by Other route.   levETIRAcetam (KEPPRA) 500 MG tablet Take 250 mg by mouth 2 (two) times daily.   metoprolol  tartrate (LOPRESSOR ) 100 MG tablet Take 1 tablet (100 mg total) by mouth once for 1 dose. PLEASE TAKE METOPROLOL  2  HOURS PRIOR TO CTA SCAN.   mirabegron ER (MYRBETRIQ) 25 MG TB24 tablet Take 25 mg by mouth daily.   Multiple Vitamin (MULTI-VITAMIN) tablet Take 1 tablet by mouth daily.   pantoprazole (PROTONIX) 40 MG tablet Take 40 mg by mouth daily.   SUMAtriptan (IMITREX) 100 MG tablet Take 100 mg by mouth as needed  for migraine.   Vitamin D, Ergocalciferol, (DRISDOL) 1.25 MG (50000 UNIT) CAPS capsule Take 50,000 Units by mouth every 7 (seven) days.     Allergies:   Penicillins, Benzalkonium chloride, Neomycin-bacitracin zn-polymyx, Pilocarpine, Sulfur, Sulfa antibiotics, and Lactose       Family History: The patient's family history includes Cancer in her mother.  ROS:   Please see the history of present illness.     All other systems reviewed and are negative.  EKGs/Labs/Other Studies Reviewed:    The following  studies were reviewed today: Stress echo at [facility] 10/05/2023 Achieved 10 METS, no ECG changes to suggest ischemia, normal LV function/regional wall motion after stress        Recent Labs: 02/23/2024: Creatinine, Ser 0.80  Recent Lipid Panel No results found for: CHOL, TRIG, HDL, CHOLHDL, VLDL, LDLCALC, LDLDIRECT 02/05/2023 Cholesterol 205, triglycerides 51, HDL 76, calculated LDL 116 TSH 1.659, hemoglobin 13.8 08/31/2023 Creatinine 0.90, potassium 4.0, glucose 92  Risk Assessment/Calculations:             Physical Exam:    VS:  BP 112/60   Pulse 79   Ht 5' (1.524 m)   Wt 117 lb (53.1 kg)   SpO2 97%   BMI 22.85 kg/m     Wt Readings from Last 3 Encounters:  04/10/24 117 lb (53.1 kg)  02/14/24 114 lb 12.8 oz (52.1 kg)  09/03/11 103 lb (46.7 kg)     General: Alert, oriented x3, no distress, lean, appears fit Head: no evidence of trauma, PERRL, EOMI, no exophtalmos or lid lag, no myxedema, no xanthelasma; normal ears, nose and oropharynx Neck: normal jugular venous pulsations and no hepatojugular reflux; brisk carotid pulses without delay and no carotid bruits Chest: clear to auscultation, no signs of consolidation by percussion or palpation, normal fremitus, symmetrical and full respiratory excursions Cardiovascular: normal position and quality of the apical impulse, regular rhythm, normal first and second heart sounds, no murmurs, rubs or gallops Abdomen: no tenderness or distention, no masses by palpation, no abnormal pulsatility or arterial bruits, normal bowel sounds, no hepatosplenomegaly Extremities: no clubbing, cyanosis or edema; 2+ radial, ulnar and brachial pulses bilaterally; 2+ right femoral, posterior tibial and dorsalis pedis pulses; 2+ left femoral, posterior tibial and dorsalis pedis pulses; no subclavian or femoral bruits Neurological: grossly nonfocal Psych: Normal mood and affect    ASSESSMENT:    1. Elevated coronary artery  calcium  score   2. Hyperlipidemia LDL goal <70   3. Microvascular angina     PLAN:    In order of problems listed above:  Elevated coronary calcium  score: Despite fairly typical symptoms of exertional angina and dyspnea, workup does not show any significant coronary obstructive lesions and she has normal left ventricular function.  Consider microvascular dysfunction.  She does have a history of migraines.  She does have a family history of early-onset heart disease.  Coronary calcium  score is markedly elevated for age and aggressive risk factor modification is indicated.   Hyperlipidemia: Based on findings on coronary calcium  score recommended target LDL cholesterol less than 70.  Recheck lipid profile after 3 months on atorvastatin  20 mg daily.      Medication Adjustments/Labs and Tests Ordered: Current medicines are reviewed at length with the patient today.  Concerns regarding medicines are outlined above.  Orders Placed This Encounter  Procedures   Lipid panel   No orders of the defined types were placed in this encounter.   Patient Instructions  Medication Instructions:  No changes *If you need a refill on your cardiac medications before your next appointment, please call your pharmacy*  Lab Work: Lipid panel- fasting lab work in 3 months If you have labs (blood work) drawn today and your tests are completely normal, you will receive your results only by: MyChart Message (if you have MyChart) OR A paper copy in the mail If you have any lab test that is abnormal or we need to change your treatment, we will call you to review the results.  Testing/Procedures: None ordered  Follow-Up: At [facility], you and your health needs are our priority.  As part of our continuing mission to provide you with exceptional heart care, our providers are all part of one team.  This team includes your primary Cardiologist (physician) and Advanced Practice Providers or APPs  (Physician Assistants and Nurse Practitioners) who all work together to provide you with the care you need, when you need it.  Your next appointment:   1 year(s)  Provider:   Dr Francyne  We recommend signing up for the patient portal called MyChart.  Sign up information is provided on this After Visit Summary.  MyChart is used to connect with patients for Virtual Visits (Telemedicine).  Patients are able to view lab/test results, encounter notes, upcoming appointments, etc.  Non-urgent messages can be sent to your provider as well.   To learn more about what you can do with MyChart, go to [email or web address].      Signed, Jerel Francyne, MD  04/14/2024 4:54 PM    [facility]  "

## 2024-04-10 NOTE — Patient Instructions (Signed)
 Medication Instructions:  No changes *If you need a refill on your cardiac medications before your next appointment, please call your pharmacy*  Lab Work: Lipid panel- fasting lab work in 3 months If you have labs (blood work) drawn today and your tests are completely normal, you will receive your results only by: MyChart Message (if you have MyChart) OR A paper copy in the mail If you have any lab test that is abnormal or we need to change your treatment, we will call you to review the results.  Testing/Procedures: None ordered  Follow-Up: At [facility], you and your health needs are our priority.  As part of our continuing mission to provide you with exceptional heart care, our providers are all part of one team.  This team includes your primary Cardiologist (physician) and Advanced Practice Providers or APPs (Physician Assistants and Nurse Practitioners) who all work together to provide you with the care you need, when you need it.  Your next appointment:   1 year(s)  Provider:   Dr Francyne  We recommend signing up for the patient portal called MyChart.  Sign up information is provided on this After Visit Summary.  MyChart is used to connect with patients for Virtual Visits (Telemedicine).  Patients are able to view lab/test results, encounter notes, upcoming appointments, etc.  Non-urgent messages can be sent to your provider as well.   To learn more about what you can do with MyChart, go to [email or web address].

## 2024-04-14 ENCOUNTER — Encounter: Payer: Self-pay | Admitting: Cardiovascular Disease

## 2024-04-14 DIAGNOSIS — R931 Abnormal findings on diagnostic imaging of heart and coronary circulation: Secondary | ICD-10-CM | POA: Insufficient documentation
